# Patient Record
Sex: Male | Born: 2000 | Hispanic: No | Marital: Single
Health system: Southern US, Community
[De-identification: ages and names within clinical notes are randomized; demographics above are authoritative.]

## PROBLEM LIST (undated history)

## (undated) DIAGNOSIS — S6990XA Unspecified injury of unspecified wrist, hand and finger(s), initial encounter: Secondary | ICD-10-CM

## (undated) DIAGNOSIS — F909 Attention-deficit hyperactivity disorder, unspecified type: Secondary | ICD-10-CM

## (undated) DIAGNOSIS — R41840 Attention and concentration deficit: Secondary | ICD-10-CM

## (undated) DIAGNOSIS — F913 Oppositional defiant disorder: Secondary | ICD-10-CM

## (undated) HISTORY — PX: WISDOM TOOTH EXTRACTION: SHX21

---
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
## 2019-03-28 ENCOUNTER — Other Ambulatory Visit: Payer: Self-pay

## 2019-03-28 ENCOUNTER — Ambulatory Visit (INDEPENDENT_AMBULATORY_CARE_PROVIDER_SITE_OTHER): Payer: Medicaid Other

## 2019-03-28 ENCOUNTER — Encounter (HOSPITAL_COMMUNITY): Payer: Self-pay

## 2019-03-28 ENCOUNTER — Ambulatory Visit (HOSPITAL_COMMUNITY)
Admission: EM | Admit: 2019-03-28 | Discharge: 2019-03-28 | Disposition: A | Discharge status: Home / Self Care | Payer: Medicaid Other | Attending: Family Medicine | Admitting: Family Medicine

## 2019-03-28 DIAGNOSIS — S99921A Unspecified injury of right foot, initial encounter: Secondary | ICD-10-CM

## 2019-03-28 DIAGNOSIS — S92421A Displaced fracture of distal phalanx of right great toe, initial encounter for closed fracture: Secondary | ICD-10-CM

## 2019-03-28 DIAGNOSIS — W51XXXA Accidental striking against or bumped into by another person, initial encounter: Secondary | ICD-10-CM

## 2019-03-28 MED ORDER — IBUPROFEN 800 MG PO TABS: 800.0000 mg | ORAL_TABLET | Freq: Three times a day (TID) | ORAL | 0 refills | Status: AC
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
## 2019-03-28 NOTE — Discharge Instructions (Addendum)
Ice and elevate to reduce swelling May take ibuprofen 3 x a day with food Wear hard sole shoe/boot at all times when walking See orthopedic within the week.  Call them tomorrow to set up an appointment
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
## 2019-03-29 ENCOUNTER — Telehealth (HOSPITAL_COMMUNITY): Payer: Self-pay | Admitting: Emergency Medicine

## 2019-03-29 NOTE — Telephone Encounter (Signed)
Mother called asking for an ortho follow up.  The one provided at discharge is a hand specialist.    This nurse spoke to dr hagler.  Dr hagler suggested [facility] and/or [facility].    Gave mother both phone numbers and ended call
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
## 2021-04-18 ENCOUNTER — Other Ambulatory Visit: Payer: Self-pay

## 2021-04-18 ENCOUNTER — Emergency Department (HOSPITAL_BASED_OUTPATIENT_CLINIC_OR_DEPARTMENT_OTHER)
Admission: EM | Admit: 2021-04-18 | Discharge: 2021-04-18 | Disposition: A | Discharge status: Home / Self Care | Payer: Medicaid Other | Attending: Emergency Medicine | Admitting: Emergency Medicine

## 2021-04-18 ENCOUNTER — Encounter (HOSPITAL_BASED_OUTPATIENT_CLINIC_OR_DEPARTMENT_OTHER): Payer: Self-pay | Admitting: Emergency Medicine

## 2021-04-18 DIAGNOSIS — R21 Rash and other nonspecific skin eruption: Secondary | ICD-10-CM | POA: Diagnosis not present

## 2021-04-18 DIAGNOSIS — R112 Nausea with vomiting, unspecified: Secondary | ICD-10-CM | POA: Insufficient documentation

## 2021-04-18 DIAGNOSIS — Z7722 Contact with and (suspected) exposure to environmental tobacco smoke (acute) (chronic): Secondary | ICD-10-CM | POA: Insufficient documentation

## 2021-04-18 DIAGNOSIS — Y93B9 Activity, other involving muscle strengthening exercises: Secondary | ICD-10-CM | POA: Diagnosis not present

## 2021-04-18 DIAGNOSIS — X58XXXA Exposure to other specified factors, initial encounter: Secondary | ICD-10-CM | POA: Insufficient documentation

## 2021-04-18 DIAGNOSIS — L299 Pruritus, unspecified: Secondary | ICD-10-CM | POA: Diagnosis not present

## 2021-04-18 DIAGNOSIS — T782XXA Anaphylactic shock, unspecified, initial encounter: Secondary | ICD-10-CM | POA: Diagnosis not present

## 2021-04-18 DIAGNOSIS — Y92017 Garden or yard in single-family (private) house as the place of occurrence of the external cause: Secondary | ICD-10-CM | POA: Insufficient documentation

## 2021-04-18 MED ORDER — EPINEPHRINE 0.3 MG/0.3ML IJ SOAJ
0.3000 mg | Freq: Once | INTRAMUSCULAR | Status: AC
  Administered 2021-04-18: 0.3 mg via INTRAMUSCULAR
  Filled 2021-04-18: qty 0.3

## 2021-04-18 MED ORDER — SODIUM CHLORIDE 0.9 % IV BOLUS
1000.0000 mL | Freq: Once | INTRAVENOUS | Status: AC
  Administered 2021-04-18: 1000 mL via INTRAVENOUS

## 2021-04-18 MED ORDER — FAMOTIDINE IN NACL 20-0.9 MG/50ML-% IV SOLN
20.0000 mg | Freq: Once | INTRAVENOUS | Status: AC
  Administered 2021-04-18: 20 mg via INTRAVENOUS
  Filled 2021-04-18: qty 50

## 2021-04-18 MED ORDER — FAMOTIDINE 40 MG PO TABS: 40.0000 mg | ORAL_TABLET | Freq: Every day | ORAL | 0 refills | Status: AC

## 2021-04-18 MED ORDER — PREDNISONE 10 MG PO TABS: 20.0000 mg | ORAL_TABLET | Freq: Every day | ORAL | 0 refills | Status: AC

## 2021-04-18 MED ORDER — DIPHENHYDRAMINE HCL 25 MG PO TABS: 25.0000 mg | ORAL_TABLET | Freq: Four times a day (QID) | ORAL | 0 refills | Status: AC

## 2021-04-18 MED ORDER — METHYLPREDNISOLONE SODIUM SUCC 125 MG IJ SOLR
125.0000 mg | Freq: Once | INTRAMUSCULAR | Status: AC
  Administered 2021-04-18 (×2): 125 mg via INTRAVENOUS
  Filled 2021-04-18: qty 2

## 2021-04-18 MED ORDER — DIPHENHYDRAMINE HCL 50 MG/ML IJ SOLN
50.0000 mg | Freq: Once | INTRAMUSCULAR | Status: AC
  Administered 2021-04-18: 50 mg via INTRAVENOUS
  Filled 2021-04-18: qty 1

## 2021-04-18 NOTE — ED Provider Notes (Signed)
[facility] EMERGENCY DEPT Provider Note   CSN: [id number] Arrival date & time: 04/18/21  1805     History Chief Complaint  Patient presents with   Insect Bite    Paul Bell is a 20 y.o. male.  HPI     20yo male with history of ODD, ADHD, presents with concern for allergic reaction. Was working out in yard when he suddenly felt something on his foot.  He then developed nausea, vomiting, lightheadedness.  No dyspnea. Has rash all over and itching all over body. Occurred 1hr ago. No hx of prior anaphylaxis or allergy.  Not sure what he was stung by.   Past Medical History:  Diagnosis Date   ADHD (attention deficit hyperactivity disorder)    Finger injury 12/22/2014   bony mallet left long finger   ODD (oppositional defiant disorder)    Short attention span     There are no problems to display for this patient.   Past Surgical History:  Procedure Laterality Date   CLOSED REDUCTION FINGER WITH PERCUTANEOUS PINNING Left 01/07/2015   Procedure: CLOSED REDUCTION FINGER WITH PERCUTANEOUS PINNING LEFT LONG FINGER;  Surgeon: Betha Loa, MD;  Location: [facility];  Service: Orthopedics;  Laterality: Left;   INCISION AND DRAINAGE ABSCESS Left 02/21/2015   Procedure: INCISION AND DRAINAGE ABSCESS;  Surgeon: Betha Loa, MD;  Location: [facility];  Service: Orthopedics;  Laterality: Left;   WISDOM TOOTH EXTRACTION         Family History  Problem Relation Age of Onset   Asthma Sister    Diabetes Maternal Grandmother    Healthy Mother    Healthy Father     Social History   Tobacco Use   Smoking status: Passive Smoke Exposure - Never Smoker   Smokeless tobacco: Never   Tobacco comments:    mother smokes inside  Vaping Use   Vaping Use: Never used  Substance Use Topics   Alcohol use: No   Drug use: No    Home Medications Prior to Admission medications   Medication Sig Start Date End Date Taking? Authorizing Provider   diphenhydrAMINE (BENADRYL) 25 MG tablet Take 1 tablet (25 mg total) by mouth every 6 (six) hours for 3 days. 04/18/21 04/21/21 Yes Alvira Monday, MD  famotidine (PEPCID) 40 MG tablet Take 1 tablet (40 mg total) by mouth daily for 3 days. 04/18/21 04/21/21 Yes Alvira Monday, MD  ibuprofen (ADVIL) 800 MG tablet Take 1 tablet (800 mg total) by mouth 3 (three) times daily. 03/28/19  Yes Eustace Moore, MD  predniSONE (DELTASONE) 10 MG tablet Take 2 tablets (20 mg total) by mouth daily for 3 days. 04/18/21 04/21/21 Yes Alvira Monday, MD  amphetamine-dextroamphetamine (ADDERALL) 20 MG tablet Take 50 mg by mouth daily.    [provider]  CLONIDINE HCL PO Take by mouth 2 (two) times daily.     [provider]    Allergies    Patient has no known allergies.  Review of Systems   Review of Systems  Constitutional:  Negative for fever.  HENT:  Negative for drooling, sore throat and trouble swallowing.   Respiratory:  Negative for cough and shortness of breath.   Cardiovascular:  Negative for chest pain.  Gastrointestinal:  Positive for nausea and vomiting. Negative for abdominal pain.  Musculoskeletal:  Negative for back pain and neck stiffness.  Skin:  Positive for rash.  Neurological:  Positive for light-headedness. Negative for syncope.   Physical Exam Updated  Vital Signs BP (!) 126/59   Pulse 73   Temp 97.7 F (36.5 C)   Resp (!) 21   Ht 5\' 11"  (1.803 m)   Wt 68 kg   SpO2 99%   BMI 20.92 kg/m   Physical Exam Vitals and nursing note reviewed.  Constitutional:      General: He is not in acute distress.    Appearance: He is well-developed. He is not diaphoretic.  HENT:     Head: Normocephalic and atraumatic.  Eyes:     Conjunctiva/sclera: Conjunctivae normal.  Cardiovascular:     Rate and Rhythm: Normal rate and regular rhythm.     Heart sounds: Normal heart sounds. No murmur heard.   No friction rub. No gallop.  Pulmonary:     Effort: Pulmonary effort  is normal. No respiratory distress.     Breath sounds: Normal breath sounds. No wheezing or rales.  Abdominal:     General: There is no distension.     Palpations: Abdomen is soft.     Tenderness: There is no abdominal tenderness. There is no guarding.  Musculoskeletal:     Cervical back: Normal range of motion.  Skin:    General: Skin is warm and dry.     Comments: urticaria  Neurological:     Mental Status: He is alert and oriented to person, place, and time.    ED Results / Procedures / Treatments   Labs (all labs ordered are listed, but only abnormal results are displayed) Labs Reviewed - No data to display  EKG None  Radiology No results found.  Procedures Procedures   Medications Ordered in ED Medications  EPINEPHrine (EPI-PEN) injection 0.3 mg (0.3 mg Intramuscular Given 04/18/21 1834)  sodium chloride 0.9 % bolus 1,000 mL (0 mLs Intravenous Stopped 04/18/21 2008)  diphenhydrAMINE (BENADRYL) injection 50 mg (50 mg Intravenous Given 04/18/21 1836)  methylPREDNISolone sodium succinate (SOLU-MEDROL) 125 mg/2 mL injection 125 mg (125 mg Intravenous Given 04/18/21 1841)  famotidine (PEPCID) IVPB 20 mg premix (0 mg Intravenous Stopped 04/18/21 1934)  EPINEPHrine (EPI-PEN) injection 0.3 mg (0.3 mg Intramuscular Provided for home use 04/18/21 2326)    ED Course  I have reviewed the triage vital signs and the nursing notes.  Pertinent labs & imaging results that were available during my care of the patient were reviewed by me and considered in my medical decision making (see chart for details).    MDM Rules/Calculators/A&P                           20yo male with history of ODD, ADHD, presents with concern for allergic reaction to a bite/sting (unconfirmed insect.)  Suspect likely bee sting.  Symptoms of diffuse urticaria, nausea and vomiting consistent with anaphylaxis.  No sign of airway compromise.   Given epi, benadryl, famotidine, solumedrol with improvement of  symptoms and no rebound during observation in the ED. Given epi pen for home, steroids, recommend benadryl/famotidine. Patient discharged in stable condition with understanding of reasons to return.    Final Clinical Impression(s) / ED Diagnoses Final diagnoses:  Anaphylaxis, initial encounter    Rx / DC Orders ED Discharge Orders          Ordered    predniSONE (DELTASONE) 10 MG tablet  Daily        04/18/21 2259    famotidine (PEPCID) 40 MG tablet  Daily        04/18/21 2259  diphenhydrAMINE (BENADRYL) 25 MG tablet  Every 6 hours        04/18/21 2259             Alvira Monday, MD 04/19/21 [phone]

## 2021-04-18 NOTE — ED Notes (Signed)
Patient provided with PO Intake as requested.

## 2021-04-18 NOTE — ED Triage Notes (Signed)
Pt presents to ED BIB [facility]. Pt c/o insect bite to L ankle. Pt then began getting dizzy, n/v. EMS VSS.

## 2022-04-07 ENCOUNTER — Encounter (HOSPITAL_COMMUNITY): Payer: Self-pay | Admitting: *Deleted

## 2022-04-07 ENCOUNTER — Ambulatory Visit (HOSPITAL_COMMUNITY)
Admission: EM | Admit: 2022-04-07 | Discharge: 2022-04-07 | Disposition: A | Discharge status: Home / Self Care | Payer: Medicaid Other | Attending: Emergency Medicine | Admitting: Emergency Medicine

## 2022-04-07 ENCOUNTER — Other Ambulatory Visit: Payer: Self-pay

## 2022-04-07 DIAGNOSIS — R1011 Right upper quadrant pain: Secondary | ICD-10-CM

## 2022-04-07 MED ORDER — METHYLPREDNISOLONE SODIUM SUCC 125 MG IJ SOLR
INTRAMUSCULAR | Status: AC
  Filled 2022-04-07: qty 2

## 2022-04-07 MED ORDER — METHYLPREDNISOLONE SODIUM SUCC 125 MG IJ SOLR
60.0000 mg | Freq: Once | INTRAMUSCULAR | Status: AC
Start: 2022-04-07 — End: 2022-04-07
  Administered 2022-04-07: 60 mg via INTRAMUSCULAR

## 2022-04-07 MED ORDER — PREDNISONE 20 MG PO TABS: 40.0000 mg | ORAL_TABLET | Freq: Every day | ORAL | 0 refills | Status: DC

## 2022-04-07 MED ORDER — KETOROLAC TROMETHAMINE 30 MG/ML IJ SOLN: 30.0000 mg | Freq: Once | INTRAMUSCULAR | Status: DC

## 2022-04-07 MED ORDER — CYCLOBENZAPRINE HCL 10 MG PO TABS: 10.0000 mg | ORAL_TABLET | Freq: Every day | ORAL | 0 refills | Status: DC

## 2022-04-07 NOTE — ED Triage Notes (Signed)
Pt points to RUQ for pain site but verbalizes rib pain.

## 2022-04-07 NOTE — ED Provider Notes (Addendum)
[facility]    CSN: [id number] Arrival date & time: 04/07/22  1025      History   Chief Complaint Chief Complaint  Patient presents with   Rib Injury   Abdominal Pain    HPI Paul Bell is a 21 y.o. male.   Patient presents with right upper quadrant abdominal pain beginning 2-1/2 hours ago after he was kicked in the stomach by his friend while they were horse playing.  Pain is described as severe, rated a 10 out of 10.  Worsened by twisting turning and bending.  Patient endorses that he became lightheaded during the car ride to the emergency department causing him to lose consciousness for about 10 seconds before coming to .  Denies nausea, vomiting, urinary changes or symptoms, coughing, shortness of breath, chest pain or tightness.  Has not attempted treatment of symptoms.  Past Medical History:  Diagnosis Date   ADHD (attention deficit hyperactivity disorder)    Finger injury 12/22/2014   bony mallet left long finger   ODD (oppositional defiant disorder)    Short attention span     There are no problems to display for this patient.   Past Surgical History:  Procedure Laterality Date   CLOSED REDUCTION FINGER WITH PERCUTANEOUS PINNING Left 01/07/2015   Procedure: CLOSED REDUCTION FINGER WITH PERCUTANEOUS PINNING LEFT LONG FINGER;  Surgeon: Betha LoaKevin Kuzma, MD;  Location: [facility];  Service: Orthopedics;  Laterality: Left;   INCISION AND DRAINAGE ABSCESS Left 02/21/2015   Procedure: INCISION AND DRAINAGE ABSCESS;  Surgeon: Betha LoaKevin Kuzma, MD;  Location: [facility];  Service: Orthopedics;  Laterality: Left;   WISDOM TOOTH EXTRACTION         Home Medications    Prior to Admission medications   Medication Sig Start Date End Date Taking? Authorizing Provider  amphetamine-dextroamphetamine (ADDERALL) 20 MG tablet Take 50 mg by mouth daily.    [provider]  CLONIDINE HCL PO Take by mouth 2 (two) times daily.      [provider]  diphenhydrAMINE (BENADRYL) 25 MG tablet Take 1 tablet (25 mg total) by mouth every 6 (six) hours for 3 days. 04/18/21 04/21/21  Alvira MondaySchlossman, Erin, MD  famotidine (PEPCID) 40 MG tablet Take 1 tablet (40 mg total) by mouth daily for 3 days. 04/18/21 04/21/21  Alvira MondaySchlossman, Erin, MD  ibuprofen (ADVIL) 800 MG tablet Take 1 tablet (800 mg total) by mouth 3 (three) times daily. 03/28/19   Eustace MooreNelson, Yvonne Sue, MD    Family History Family History  Problem Relation Age of Onset   Asthma Sister    Diabetes Maternal Grandmother    Healthy Mother    Healthy Father     Social History Social History   Tobacco Use   Smoking status: Passive Smoke Exposure - Never Smoker   Smokeless tobacco: Never   Tobacco comments:    mother smokes inside  Vaping Use   Vaping Use: Never used  Substance Use Topics   Alcohol use: No   Drug use: No     Allergies   Patient has no known allergies.   Review of Systems Review of Systems  Constitutional: Negative.   Respiratory: Negative.    Cardiovascular: Negative.   Gastrointestinal:  Positive for abdominal pain. Negative for abdominal distention, anal bleeding, blood in stool, constipation, diarrhea, nausea, rectal pain and vomiting.  Skin: Negative.   Neurological: Negative.     Physical Exam Triage Vital Signs ED Triage Vitals  Enc Vitals Group  BP 04/07/22 1200 131/86     Pulse Rate 04/07/22 1200 (!) 54     Resp 04/07/22 1200 20     Temp 04/07/22 1200 98.1 F (36.7 C)     Temp src --      SpO2 04/07/22 1200 99 %     Weight --      Height --      Head Circumference --      Peak Flow --      Pain Score 04/07/22 1158 7     Pain Loc --      Pain Edu? --      Excl. in GC? --    No data found.  Updated Vital Signs BP 131/86   Pulse (!) 54   Temp 98.1 F (36.7 C)   Resp 20   SpO2 99%   Visual Acuity Right Eye Distance:   Left Eye Distance:   Bilateral Distance:    Right Eye Near:   Left Eye Near:     Bilateral Near:     Physical Exam Constitutional:      Appearance: Normal appearance. He is well-developed.  HENT:     Head: Normocephalic.  Eyes:     Extraocular Movements: Extraocular movements intact.  Pulmonary:     Effort: Pulmonary effort is normal.  Abdominal:     General: Abdomen is flat. Bowel sounds are normal.     Palpations: Abdomen is soft.     Tenderness: There is abdominal tenderness in the right upper quadrant.     Comments: No ecchymosis, hematoma, mass noted to the abdomen  Skin:    General: Skin is warm and dry.  Neurological:     General: No focal deficit present.     Mental Status: He is alert and oriented to person, place, and time.  Psychiatric:        Mood and Affect: Mood normal.        Behavior: Behavior normal.     UC Treatments / Results  Labs (all labs ordered are listed, but only abnormal results are displayed) Labs Reviewed - No data to display  EKG   Radiology No results found.  Procedures Procedures (including critical care time)  Medications Ordered in UC Medications - No data to display  Initial Impression / Assessment and Plan / UC Course  I have reviewed the triage vital signs and the nursing notes.  Pertinent labs & imaging results that were available during my care of the patient were reviewed by me and considered in my medical decision making (see chart for details).  Right upper quadrant abdominal pain  Vital signs are stable, EKG showing sinus bradycardia, while patient visibly uncomfortable he is in no signs of distress at this time, etiology is most likely muscular injury due to direct injury, low suspicion for organ involvement however patient is given strict precautions that if there are no signs of improvement in pain within the next 24 hours she begins to notice bruising or begins to have nausea or vomiting patient is to go to the nearest emergency department for further evaluation and imaging , given  methylprednisolone  injection given in office and prednisone 40 mg burst as well as Flexeril prescribed for outpatient management May use Tylenol in addition, recommended RICE, may follow-up with this urgent care as needed Final Clinical Impressions(s) / UC Diagnoses   Final diagnoses:  None   Discharge Instructions   None    ED Prescriptions   None  PDMP not reviewed this encounter.   Valinda Hoar, NP 04/07/22 1242    Valinda Hoar, NP 04/07/22 1243

## 2022-04-07 NOTE — Discharge Instructions (Addendum)
Today you are being treated for pain so that abdomen, as this injury occurred 2 hours ago we will help you manage the pain  If pain remains severe and you do not see any signs of improvement or you begin to have vomiting, bruising over the area or have a another episode of passing out you will need to go to the nearest emergency department for evaluation and imaging of your organs  You have been given an injection of steroids here in the office to help reduce any inflammation that occurs with muscle injury to help calm your pain  Starting tomorrow you will take prednisone every morning with food for 5 days to continue this process  You may use the muscle relaxer twice a day for additional comfort, be mindful this medication may make you drowsy, if this occurs you may use half a tablet or just use at bedtime  You may place ice over the affected area in 10 to 15-minute intervals  You may wear a compression binder for additional support and stability  You may place pillows behind back for additional support and comfort

## 2022-04-07 NOTE — ED Triage Notes (Signed)
Pt reports he was " Horse playing " with another family member about 4 hrs ago . Pt has developed Rt sided pain.

## 2022-09-09 ENCOUNTER — Encounter (HOSPITAL_COMMUNITY): Payer: Self-pay | Admitting: Emergency Medicine

## 2022-09-09 ENCOUNTER — Ambulatory Visit (HOSPITAL_COMMUNITY)
Admission: EM | Admit: 2022-09-09 | Discharge: 2022-09-09 | Disposition: A | Discharge status: Home / Self Care | Payer: Medicaid Other | Attending: Family Medicine | Admitting: Family Medicine

## 2022-09-09 DIAGNOSIS — S61412A Laceration without foreign body of left hand, initial encounter: Secondary | ICD-10-CM

## 2022-09-09 MED ORDER — ONDANSETRON 4 MG PO TBDP
ORAL_TABLET | ORAL | Status: AC
  Filled 2022-09-09: qty 1

## 2022-09-09 MED ORDER — LIDOCAINE HCL (PF) 1 % IJ SOLN
INTRAMUSCULAR | Status: AC
  Filled 2022-09-09: qty 30

## 2022-09-09 MED ORDER — ONDANSETRON 4 MG PO TBDP
4.0000 mg | ORAL_TABLET | Freq: Once | ORAL | Status: AC
Start: 2022-09-09 — End: 2022-09-09
  Administered 2022-09-09: 4 mg via ORAL

## 2022-09-09 NOTE — ED Triage Notes (Signed)
Pt reports a laceration in the palm of left hand. States injury occurred about 3 hours ago while trying to sharpen a knife. Unsure about last Tetanus.

## 2022-09-09 NOTE — Discharge Instructions (Signed)
If not allergic, you may use over the counter ibuprofen or acetaminophen as needed. ° °

## 2022-09-10 NOTE — ED Provider Notes (Signed)
  [facility]   [id number] 09/09/22 Arrival Time: 3888  ASSESSMENT & PLAN:  1. Laceration of left hand, foreign body presence unspecified, initial encounter    Meds ordered this encounter  Medications   ondansetron (ZOFRAN-ODT) disintegrating tablet 4 mg   Procedure: Laceration Repair Verbal consent obtained. Patient provided with risks and alternatives to the procedure. Wound copiously irrigated with NS then cleansed with betadine. Local anesthesia: Lidocaine 1% without epinephrine. Wound carefully explored. No foreign body, tendon injury, or nonviable tissue were noted. Using sterile technique, 4 interrupted 4-0 Prolene sutures were placed to reapproximate the wound. Minimal bleeding. Advised to look for and return for any signs of infection such as redness, swelling, discharge, or worsening pain. Return for suture removal in 7 days.  Complications: vagal reaction during procedure; quick recovery; sipping on fluids without difficulty.    Discharge Instructions      If not allergic, you may use over the counter ibuprofen or acetaminophen as needed.    Reviewed expectations re: course of current medical issues. Questions answered. Outlined signs and symptoms indicating need for more acute intervention. Patient verbalized understanding. After Visit Summary given.   SUBJECTIVE:  Paul Bell is a 21 y.o. male who presents with a laceration of his LEFT thumb; today; cut while sharpening knife.  Td UTD: yes; 2021   OBJECTIVE:  Vitals:   09/09/22 1921  BP: (!) 149/79  Pulse: 62  Resp: 18  Temp: 98.4 F (36.9 C)  TempSrc: Oral  SpO2: 98%    General appearance: alert; no distress Skin: linear laceration of LEFT thenar palmar hand; size: approx 1 cm; clean wound edges, no foreign bodies; without active bleeding Psychological: alert and cooperative; normal mood and affect  No Known Allergies  Past Medical History:  Diagnosis Date   ADHD (attention  deficit hyperactivity disorder)    Finger injury 12/22/2014   bony mallet left long finger   ODD (oppositional defiant disorder)    Short attention span    Social History   Socioeconomic History   Marital status: Single    Spouse name: Not on file   Number of children: Not on file   Years of education: Not on file   Highest education level: Not on file  Occupational History   Not on file  Tobacco Use   Smoking status: Passive Smoke Exposure - Never Smoker   Smokeless tobacco: Never   Tobacco comments:    mother smokes inside  Vaping Use   Vaping Use: Never used  Substance and Sexual Activity   Alcohol use: No   Drug use: No   Sexual activity: Never  Other Topics Concern   Not on file  Social History Narrative   Not on file   Social Determinants of Health   Financial Resource Strain: Not on file  Food Insecurity: Not on file  Transportation Needs: Not on file  Physical Activity: Not on file  Stress: Not on file  Social Connections: Not on file          Mardella Layman, MD 09/10/22 1353

## 2023-01-11 ENCOUNTER — Encounter (HOSPITAL_COMMUNITY): Payer: Self-pay

## 2023-01-11 ENCOUNTER — Ambulatory Visit (HOSPITAL_COMMUNITY)
Admission: EM | Admit: 2023-01-11 | Discharge: 2023-01-11 | Disposition: A | Discharge status: Home / Self Care | Payer: Self-pay | Attending: Nurse Practitioner | Admitting: Nurse Practitioner

## 2023-01-11 DIAGNOSIS — L02811 Cutaneous abscess of head [any part, except face]: Secondary | ICD-10-CM

## 2023-01-11 MED ORDER — DOXYCYCLINE HYCLATE 100 MG PO CAPS: 100.0000 mg | ORAL_CAPSULE | Freq: Two times a day (BID) | ORAL | 0 refills | Status: AC

## 2023-01-11 NOTE — ED Triage Notes (Signed)
Pt c/o a red tender raised area to rt upper cheek area x2 days. States it made it rt eye swell and giving him a headache. Took a goody powder with relief.

## 2023-01-11 NOTE — Discharge Instructions (Signed)
Please take the antibiotic doxycycline as prescribed to treat any infection that is in the hard area on your face.  This should also help with the headache.  You can also use warm compresses to help dry out infection.  Take Tylenol 500 to 1000 mg every 6 hours alternating with ibuprofen 800 mg every 8 hours as needed for pain.  Seek care if symptoms persist or worsen despite treatment.

## 2023-01-11 NOTE — ED Provider Notes (Signed)
[place]    CSN: [id number] Arrival date & time: 01/11/23  1154      History   Chief Complaint Chief Complaint  Patient presents with   Rash    HPI Paul Bell is a 22 y.o. male.   Patient presents today for swollen, painful area to the right side of his face for the past 2 days.  Reports it was more raised and red, however is gone away a little bit.  Reports area is still painful to touch and his right eye feels swollen.  He also has a headache.  No fever, chills,  nausea/vomiting.  Reports he took a Goody powder yesterday which helped with the pain.    Past Medical History:  Diagnosis Date   ADHD (attention deficit hyperactivity disorder)    Finger injury 12/22/2014   bony mallet left long finger   ODD (oppositional defiant disorder)    Short attention span     There are no problems to display for this patient.   Past Surgical History:  Procedure Laterality Date   CLOSED REDUCTION FINGER WITH PERCUTANEOUS PINNING Left 01/07/2015   Procedure: CLOSED REDUCTION FINGER WITH PERCUTANEOUS PINNING LEFT LONG FINGER;  Surgeon: Leanora Cover, MD;  Location: [facility];  Service: Orthopedics;  Laterality: Left;   INCISION AND DRAINAGE ABSCESS Left 02/21/2015   Procedure: INCISION AND DRAINAGE ABSCESS;  Surgeon: Leanora Cover, MD;  Location: [facility];  Service: Orthopedics;  Laterality: Left;   WISDOM TOOTH EXTRACTION         Home Medications    Prior to Admission medications   Medication Sig Start Date End Date Taking? Authorizing Provider  doxycycline (VIBRAMYCIN) 100 MG capsule Take 1 capsule (100 mg total) by mouth 2 (two) times daily for 7 days. 01/11/23 01/18/23 Yes Noemi Chapel A, NP  amphetamine-dextroamphetamine (ADDERALL) 20 MG tablet Take 50 mg by mouth daily. Patient not taking: Reported on 01/11/2023    [provider]  CLONIDINE HCL PO Take by mouth 2 (two) times daily.  Patient not taking: Reported  on 01/11/2023    [provider]  diphenhydrAMINE (BENADRYL) 25 MG tablet Take 1 tablet (25 mg total) by mouth every 6 (six) hours for 3 days. 04/18/21 04/21/21  Gareth Morgan, MD  famotidine (PEPCID) 40 MG tablet Take 1 tablet (40 mg total) by mouth daily for 3 days. 04/18/21 04/21/21  Gareth Morgan, MD  ibuprofen (ADVIL) 800 MG tablet Take 1 tablet (800 mg total) by mouth 3 (three) times daily. 03/28/19   Raylene Everts, MD    Family History Family History  Problem Relation Age of Onset   Asthma Sister    Diabetes Maternal Grandmother    Healthy Mother    Healthy Father     Social History Social History   Tobacco Use   Smoking status: Passive Smoke Exposure - Never Smoker   Smokeless tobacco: Never   Tobacco comments:    mother smokes inside  Vaping Use   Vaping Use: Never used  Substance Use Topics   Alcohol use: No   Drug use: No     Allergies   Patient has no known allergies.   Review of Systems Review of Systems Per HPI  Physical Exam Triage Vital Signs ED Triage Vitals [01/11/23 1316]  Enc Vitals Group     BP (!) 142/86     Pulse Rate (!) 59     Resp 18     Temp 97.8 F (36.6  C)     Temp Source Oral     SpO2 98 %     Weight      Height      Head Circumference      Peak Flow      Pain Score 5     Pain Loc      Pain Edu?      Excl. in [place]?    No data found.  Updated Vital Signs BP (!) 142/86 (BP Location: Left Arm)   Pulse (!) 59   Temp 97.8 F (36.6 C) (Oral)   Resp 18   SpO2 98%   Visual Acuity Right Eye Distance:   Left Eye Distance:   Bilateral Distance:    Right Eye Near:   Left Eye Near:    Bilateral Near:     Physical Exam Vitals and nursing note reviewed.  Constitutional:      General: He is not in acute distress.    Appearance: Normal appearance. He is not toxic-appearing.  HENT:     Head: Normocephalic and atraumatic.      Comments: Flesh colored, tender, indurated, mass approximately 1.5 x 1 cm and  circular consistent with abscess.  No active drainage, redness, warmth, or fluctuance.     Mouth/Throat:     Mouth: Mucous membranes are moist.     Pharynx: Oropharynx is clear. No oropharyngeal exudate or posterior oropharyngeal erythema.  Pulmonary:     Effort: Pulmonary effort is normal. No respiratory distress.  Skin:    General: Skin is warm and dry.     Capillary Refill: Capillary refill takes less than 2 seconds.     Coloration: Skin is not jaundiced or pale.     Findings: No erythema.  Neurological:     Mental Status: He is alert and oriented to person, place, and time.  Psychiatric:        Behavior: Behavior is cooperative.      UC Treatments / Results  Labs (all labs ordered are listed, but only abnormal results are displayed) Labs Reviewed - No data to display  EKG   Radiology No results found.  Procedures Procedures (including critical care time)  Medications Ordered in UC Medications - No data to display  Initial Impression / Assessment and Plan / UC Course  I have reviewed the triage vital signs and the nursing notes.  Pertinent labs & imaging results that were available during my care of the patient were reviewed by me and considered in my medical decision making (see chart for details).   Patient is well-appearing, normotensive, afebrile, not tachycardic, not tachypneic, oxygenating well on room air.    1. Abscess of head No indication for I&D today given no fluctuance Will treat with doxycycline twice daily for 7 days Supportive care discussed including warm compresses, Tylenol ibuprofen as needed for pain Seek care if symptoms persist or worsen despite treatment  The patient was given the opportunity to ask questions.  All questions answered to their satisfaction.  The patient is in agreement to this plan.    Final Clinical Impressions(s) / UC Diagnoses   Final diagnoses:  Abscess of head     Discharge Instructions      Please take the  antibiotic doxycycline as prescribed to treat any infection that is in the hard area on your face.  This should also help with the headache.  You can also use warm compresses to help dry out infection.  Take Tylenol 500 to 1000 mg  every 6 hours alternating with ibuprofen 800 mg every 8 hours as needed for pain.  Seek care if symptoms persist or worsen despite treatment.   ED Prescriptions     Medication Sig Dispense Auth. Provider   doxycycline (VIBRAMYCIN) 100 MG capsule Take 1 capsule (100 mg total) by mouth 2 (two) times daily for 7 days. 14 capsule Eulogio Bear, NP      PDMP not reviewed this encounter.   Eulogio Bear, NP 01/11/23 [phone]

## 2023-03-06 ENCOUNTER — Ambulatory Visit
Admission: EM | Admit: 2023-03-06 | Discharge: 2023-03-06 | Disposition: A | Discharge status: Home / Self Care | Payer: 59 | Attending: Nurse Practitioner | Admitting: Nurse Practitioner

## 2023-03-06 DIAGNOSIS — J069 Acute upper respiratory infection, unspecified: Secondary | ICD-10-CM | POA: Diagnosis present

## 2023-03-06 DIAGNOSIS — F909 Attention-deficit hyperactivity disorder, unspecified type: Secondary | ICD-10-CM | POA: Diagnosis not present

## 2023-03-06 DIAGNOSIS — Z7722 Contact with and (suspected) exposure to environmental tobacco smoke (acute) (chronic): Secondary | ICD-10-CM | POA: Diagnosis not present

## 2023-03-06 DIAGNOSIS — Z1152 Encounter for screening for COVID-19: Secondary | ICD-10-CM | POA: Diagnosis not present

## 2023-03-06 DIAGNOSIS — R051 Acute cough: Secondary | ICD-10-CM | POA: Diagnosis present

## 2023-03-06 NOTE — ED Provider Notes (Signed)
[facility]    CSN: [id number] Arrival date & time: 03/06/23  1534      History   Chief Complaint Chief Complaint  Patient presents with   Headache   Cough    HPI Paul Bell is a 22 y.o. male  presents for evaluation of URI symptoms for 2 days. Patient reports associated symptoms of cough, runny nose, and headache. Denies N/V/D, sore throat, fevers, ear pain, body aches, shortness of breath. Patient does not have a hx of asthma.  Does vape marijuana.  Reports family members have similar symptoms.  Pt has taken nothing OTC for symptoms. Pt has no other concerns at this time.    Headache Associated symptoms: cough   Cough Associated symptoms: headaches     Past Medical History:  Diagnosis Date   ADHD (attention deficit hyperactivity disorder)    Finger injury 12/22/2014   bony mallet left long finger   ODD (oppositional defiant disorder)    Short attention span     There are no problems to display for this patient.   Past Surgical History:  Procedure Laterality Date   CLOSED REDUCTION FINGER WITH PERCUTANEOUS PINNING Left 01/07/2015   Procedure: CLOSED REDUCTION FINGER WITH PERCUTANEOUS PINNING LEFT LONG FINGER;  Surgeon: Betha Loa, MD;  Location: [facility];  Service: Orthopedics;  Laterality: Left;   INCISION AND DRAINAGE ABSCESS Left 02/21/2015   Procedure: INCISION AND DRAINAGE ABSCESS;  Surgeon: Betha Loa, MD;  Location: [facility];  Service: Orthopedics;  Laterality: Left;   WISDOM TOOTH EXTRACTION         Home Medications    Prior to Admission medications   Medication Sig Start Date End Date Taking? Authorizing Provider  amphetamine-dextroamphetamine (ADDERALL) 20 MG tablet Take 50 mg by mouth daily. Patient not taking: Reported on 01/11/2023    [provider]  CLONIDINE HCL PO Take by mouth 2 (two) times daily.  Patient not taking: Reported on 01/11/2023    [provider]   diphenhydrAMINE (BENADRYL) 25 MG tablet Take 1 tablet (25 mg total) by mouth every 6 (six) hours for 3 days. 04/18/21 04/21/21  Alvira Monday, MD  famotidine (PEPCID) 40 MG tablet Take 1 tablet (40 mg total) by mouth daily for 3 days. 04/18/21 04/21/21  Alvira Monday, MD  ibuprofen (ADVIL) 800 MG tablet Take 1 tablet (800 mg total) by mouth 3 (three) times daily. 03/28/19   Eustace Moore, MD    Family History Family History  Problem Relation Age of Onset   Asthma Sister    Diabetes Maternal Grandmother    Healthy Mother    Healthy Father     Social History Social History   Tobacco Use   Smoking status: Passive Smoke Exposure - Never Smoker   Smokeless tobacco: Never   Tobacco comments:    mother smokes inside  Vaping Use   Vaping Use: Never used  Substance Use Topics   Alcohol use: No   Drug use: No     Allergies   Patient has no known allergies.   Review of Systems Review of Systems  Respiratory:  Positive for cough.   Neurological:  Positive for headaches.     Physical Exam Triage Vital Signs ED Triage Vitals  Enc Vitals Group     BP 03/06/23 1543 126/64     Pulse Rate 03/06/23 1542 88     Resp 03/06/23 1542 17     Temp 03/06/23 1542 98.8 F (37.1 C)  Temp Source 03/06/23 1542 Oral     SpO2 03/06/23 1542 96 %     Weight --      Height --      Head Circumference --      Peak Flow --      Pain Score 03/06/23 1541 8     Pain Loc --      Pain Edu? --      Excl. in [place]? --    No data found.  Updated Vital Signs BP 126/64   Pulse 88   Temp 98.8 F (37.1 C) (Oral)   Resp 17   SpO2 96%   Visual Acuity Right Eye Distance:   Left Eye Distance:   Bilateral Distance:    Right Eye Near:   Left Eye Near:    Bilateral Near:     Physical Exam Vitals and nursing note reviewed.  Constitutional:      General: He is not in acute distress.    Appearance: Normal appearance. He is not ill-appearing or toxic-appearing.     Comments: Patient  smells very strongly of marijuana  HENT:     Head: Normocephalic and atraumatic.     Right Ear: Tympanic membrane and ear canal normal.     Left Ear: Tympanic membrane and ear canal normal.     Nose: Rhinorrhea present. No congestion.     Mouth/Throat:     Mouth: Mucous membranes are moist.     Pharynx: No oropharyngeal exudate or posterior oropharyngeal erythema.  Eyes:     Pupils: Pupils are equal, round, and reactive to light.  Cardiovascular:     Rate and Rhythm: Normal rate and regular rhythm.     Heart sounds: Normal heart sounds.  Pulmonary:     Effort: Pulmonary effort is normal.     Breath sounds: Normal breath sounds.  Musculoskeletal:     Cervical back: Normal range of motion and neck supple.  Lymphadenopathy:     Cervical: No cervical adenopathy.  Skin:    General: Skin is warm and dry.  Neurological:     General: No focal deficit present.     Mental Status: He is alert and oriented to person, place, and time.  Psychiatric:        Mood and Affect: Mood normal.        Behavior: Behavior normal.      UC Treatments / Results  Labs (all labs ordered are listed, but only abnormal results are displayed) Labs Reviewed  SARS CORONAVIRUS 2 (TAT 6-24 HRS)    EKG   Radiology No results found.  Procedures Procedures (including critical care time)  Medications Ordered in UC Medications - No data to display  Initial Impression / Assessment and Plan / UC Course  I have reviewed the triage vital signs and the nursing notes.  Pertinent labs & imaging results that were available during my care of the patient were reviewed by me and considered in my medical decision making (see chart for details).     Reviewed exam and symptoms with patient.  No red flags. COVID PCR and will contact if positive Discussed viral illness and symptomatic treatment Rest and fluids Patient declined Rx cough medicine will use OTC medicines as needed PCP follow-up if symptoms do not  improve ER precautions reviewed and patient verbalized understanding Final Clinical Impressions(s) / UC Diagnoses   Final diagnoses:  Acute cough  Viral upper respiratory infection     Discharge Instructions  Your symptoms and exam are consistent for a viral illness. Please treat your symptoms with over the counter cough medication, tylenol or ibuprofen, humidifier, and rest. Viral illnesses can last 7-14 days. Please follow up with your PCP if your symptoms are not improving. Please go to the ER for any worsening symptoms. This includes but is not limited to fever you can not control with tylenol or ibuprofen, you are not able to stay hydrated, you have shortness of breath or chest pain.  Thank you for choosing [facility] for your healthcare needs. I hope you feel better soon!    ED Prescriptions   None    PDMP not reviewed this encounter.   Radford Pax, NP 03/06/23 [phone]

## 2023-03-06 NOTE — Discharge Instructions (Signed)
Your symptoms and exam are consistent for a viral illness. Please treat your symptoms with over the counter cough medication, tylenol or ibuprofen, humidifier, and rest. Viral illnesses can last 7-14 days. Please follow up with your PCP if your symptoms are not improving. Please go to the ER for any worsening symptoms. This includes but is not limited to fever you can not control with tylenol or ibuprofen, you are not able to stay hydrated, you have shortness of breath or chest pain.  Thank you for choosing [facility] for your healthcare needs. I hope you feel better soon!  

## 2023-03-06 NOTE — ED Triage Notes (Signed)
Pt presents with a headache and cough X 2 days.   Has not taken anything at home for relief.

## 2023-03-07 LAB — SARS CORONAVIRUS 2 (TAT 6-24 HRS): SARS Coronavirus 2: NEGATIVE

## 2023-06-19 ENCOUNTER — Ambulatory Visit
Admission: EM | Admit: 2023-06-19 | Discharge: 2023-06-19 | Disposition: A | Discharge status: Home / Self Care | Payer: 59 | Attending: Internal Medicine | Admitting: Internal Medicine

## 2023-06-19 DIAGNOSIS — S90512A Abrasion, left ankle, initial encounter: Secondary | ICD-10-CM | POA: Diagnosis not present

## 2023-06-19 MED ORDER — BACITRACIN ZINC 500 UNIT/GM EX OINT: 1.0000 | TOPICAL_OINTMENT | Freq: Two times a day (BID) | CUTANEOUS | 0 refills | Status: AC

## 2023-06-19 NOTE — ED Provider Notes (Signed)
[facility]  Note:  This document was prepared using Conservation officer, historic buildings and may include unintentional dictation errors.  MRN: [id number] DOB: 02/01/2001  Subjective:   Paul Bell is a 22 y.o. male presenting for 1 day history of acute onset left ankle pain, inflammation. Patient was trying to put a go cart away at work to get it out of the rain.  Unfortunately his foot slipped on the pedal and went past the break, scraping against the inside of the car.  Felt a hot sensation of the posterior left ankle but it is improved.  Still has some pain.  No current facility-administered medications for this encounter.  Current Outpatient Medications:    amphetamine-dextroamphetamine (ADDERALL) 20 MG tablet, Take 50 mg by mouth daily. (Patient not taking: Reported on 01/11/2023), Disp: , Rfl:    CLONIDINE HCL PO, Take by mouth 2 (two) times daily.  (Patient not taking: Reported on 01/11/2023), Disp: , Rfl:    diphenhydrAMINE (BENADRYL) 25 MG tablet, Take 1 tablet (25 mg total) by mouth every 6 (six) hours for 3 days., Disp: 12 tablet, Rfl: 0   famotidine (PEPCID) 40 MG tablet, Take 1 tablet (40 mg total) by mouth daily for 3 days., Disp: 3 tablet, Rfl: 0   ibuprofen (ADVIL) 800 MG tablet, Take 1 tablet (800 mg total) by mouth 3 (three) times daily., Disp: 21 tablet, Rfl: 0   No Known Allergies  Past Medical History:  Diagnosis Date   ADHD (attention deficit hyperactivity disorder)    Finger injury 12/22/2014   bony mallet left long finger   ODD (oppositional defiant disorder)    Short attention span      Past Surgical History:  Procedure Laterality Date   CLOSED REDUCTION FINGER WITH PERCUTANEOUS PINNING Left 01/07/2015   Procedure: CLOSED REDUCTION FINGER WITH PERCUTANEOUS PINNING LEFT LONG FINGER;  Surgeon: Betha Loa, MD;  Location: [facility];  Service: Orthopedics;  Laterality: Left;   INCISION AND DRAINAGE ABSCESS Left  02/21/2015   Procedure: INCISION AND DRAINAGE ABSCESS;  Surgeon: Betha Loa, MD;  Location: [facility];  Service: Orthopedics;  Laterality: Left;   WISDOM TOOTH EXTRACTION      Family History  Problem Relation Age of Onset   Asthma Sister    Diabetes Maternal Grandmother    Healthy Mother    Healthy Father     Social History   Tobacco Use   Smoking status: Passive Smoke Exposure - Never Smoker   Smokeless tobacco: Never   Tobacco comments:    mother smokes inside  Vaping Use   Vaping status: Every Day  Substance Use Topics   Alcohol use: Yes    Comment: occ   Drug use: Yes    Types: Marijuana    ROS   Objective:   Vitals: BP 112/73 (BP Location: Right Arm)   Pulse (!) 57   Temp 98.6 F (37 C) (Oral)   Resp 16   SpO2 98%   Physical Exam Constitutional:      General: He is not in acute distress.    Appearance: Normal appearance. He is well-developed and normal weight. He is not ill-appearing, toxic-appearing or diaphoretic.  HENT:     Head: Normocephalic and atraumatic.     Right Ear: External ear normal.     Left Ear: External ear normal.     Nose: Nose normal.     Mouth/Throat:     Pharynx: Oropharynx is clear.  Eyes:     General: No scleral icterus.       Right eye: No discharge.        Left eye: No discharge.     Extraocular Movements: Extraocular movements intact.  Cardiovascular:     Rate and Rhythm: Normal rate.  Pulmonary:     Effort: Pulmonary effort is normal.  Musculoskeletal:     Cervical back: Normal range of motion.     Left ankle: No swelling, deformity, ecchymosis or lacerations. Tenderness present. No lateral malleolus, medial malleolus, ATF ligament, AITF ligament, CF ligament, posterior TF ligament, base of 5th metatarsal or proximal fibula tenderness. Normal range of motion.     Left Achilles Tendon: Tenderness present. No defects. Thompson's test negative.       Legs:  Neurological:     Mental Status: He is alert  and oriented to person, place, and time.  Psychiatric:        Mood and Affect: Mood normal.        Behavior: Behavior normal.        Thought Content: Thought content normal.        Judgment: Judgment normal.    I applied a dressing using bacitracin, covered with nonadherent dressing and secured with Coban.  Assessment and Plan :   PDMP not reviewed this encounter.  1. Abrasion of left ankle without infection, initial encounter    Patient has a mild abrasion, wound care reviewed.  No signs of secondary cellulitis or infection.  Deferred imaging as I have no suspicion for fracture.  Counseled patient on potential for adverse effects with medications prescribed/recommended today, ER and return-to-clinic precautions discussed, patient verbalized understanding.    Wallis Bamberg, PA-C 06/19/23 1329

## 2023-06-19 NOTE — ED Triage Notes (Signed)
Pt injured left ankle on go cart pedal yesterday-no pain meds PTA-NAD-steady gait

## 2023-06-19 NOTE — Discharge Instructions (Addendum)
Change your dressing 2 times daily. Every time you change your dressing, clean the wound gently with warm water and Dial antibacterial soap. Pat the wound dry, let it breathe for roughly an hour before covering it back up. When you reapply a dressing, apply Bacitracin ointment to the wound, then cover with non-stick/non-adherent gauze.  After 4-5 days, the wound should scab over nicely and then you don't have to continue doing dressings.

## 2023-12-25 ENCOUNTER — Encounter (HOSPITAL_COMMUNITY): Payer: Self-pay | Admitting: Emergency Medicine

## 2023-12-25 ENCOUNTER — Emergency Department (HOSPITAL_COMMUNITY)
Admission: EM | Admit: 2023-12-25 | Discharge: 2023-12-25 | Disposition: A | Discharge status: Home / Self Care | Payer: 59 | Attending: Emergency Medicine | Admitting: Emergency Medicine

## 2023-12-25 ENCOUNTER — Emergency Department (HOSPITAL_COMMUNITY): Payer: 59

## 2023-12-25 ENCOUNTER — Other Ambulatory Visit: Payer: Self-pay

## 2023-12-25 DIAGNOSIS — Z79899 Other long term (current) drug therapy: Secondary | ICD-10-CM | POA: Diagnosis not present

## 2023-12-25 DIAGNOSIS — R197 Diarrhea, unspecified: Secondary | ICD-10-CM | POA: Diagnosis present

## 2023-12-25 DIAGNOSIS — K529 Noninfective gastroenteritis and colitis, unspecified: Secondary | ICD-10-CM | POA: Insufficient documentation

## 2023-12-25 LAB — RAPID URINE DRUG SCREEN, HOSP PERFORMED
Amphetamines: NOT DETECTED
Barbiturates: NOT DETECTED
Benzodiazepines: NOT DETECTED
Cocaine: NOT DETECTED
Opiates: NOT DETECTED
Tetrahydrocannabinol: POSITIVE — AB

## 2023-12-25 LAB — URINALYSIS, ROUTINE W REFLEX MICROSCOPIC
Bacteria, UA: NONE SEEN
Bilirubin Urine: NEGATIVE
Glucose, UA: NEGATIVE mg/dL
Ketones, ur: NEGATIVE mg/dL
Leukocytes,Ua: NEGATIVE
Nitrite: NEGATIVE
Protein, ur: NEGATIVE mg/dL
Specific Gravity, Urine: 1.02 (ref 1.005–1.030)
pH: 5 (ref 5.0–8.0)

## 2023-12-25 LAB — CBC
HCT: 49.6 % (ref 39.0–52.0)
Hemoglobin: 16.6 g/dL (ref 13.0–17.0)
MCH: 26 pg (ref 26.0–34.0)
MCHC: 33.5 g/dL (ref 30.0–36.0)
MCV: 77.7 fL — ABNORMAL LOW (ref 80.0–100.0)
Platelets: 325 10*3/uL (ref 150–400)
RBC: 6.38 MIL/uL — ABNORMAL HIGH (ref 4.22–5.81)
RDW: 13.1 % (ref 11.5–15.5)
WBC: 17.9 10*3/uL — ABNORMAL HIGH (ref 4.0–10.5)
nRBC: 0 % (ref 0.0–0.2)

## 2023-12-25 LAB — COMPREHENSIVE METABOLIC PANEL
ALT: 14 U/L (ref 0–44)
AST: 20 U/L (ref 15–41)
Albumin: 4.8 g/dL (ref 3.5–5.0)
Alkaline Phosphatase: 63 U/L (ref 38–126)
Anion gap: 11 (ref 5–15)
BUN: 11 mg/dL (ref 6–20)
CO2: 20 mmol/L — ABNORMAL LOW (ref 22–32)
Calcium: 9.7 mg/dL (ref 8.9–10.3)
Chloride: 107 mmol/L (ref 98–111)
Creatinine, Ser: 0.96 mg/dL (ref 0.61–1.24)
GFR, Estimated: >60 mL/min (ref >60)
Glucose, Bld: 96 mg/dL (ref 70–99)
Potassium: 3.9 mmol/L (ref 3.5–5.1)
Sodium: 138 mmol/L (ref 135–145)
Total Bilirubin: 1.2 mg/dL (ref 0.0–1.2)
Total Protein: 8.4 g/dL — ABNORMAL HIGH (ref 6.5–8.1)

## 2023-12-25 LAB — RESP PANEL BY RT-PCR (RSV, FLU A&B, COVID)  RVPGX2
Influenza A by PCR: NEGATIVE
Influenza B by PCR: NEGATIVE
Resp Syncytial Virus by PCR: NEGATIVE
SARS Coronavirus 2 by RT PCR: NEGATIVE

## 2023-12-25 LAB — ETHANOL: Alcohol, Ethyl (B): <10 mg/dL (ref <10)

## 2023-12-25 LAB — LIPASE, BLOOD: Lipase: 26 U/L (ref 11–51)

## 2023-12-25 MED ORDER — METOCLOPRAMIDE HCL 5 MG/ML IJ SOLN
10.0000 mg | Freq: Once | INTRAMUSCULAR | Status: AC
  Administered 2023-12-25: 10 mg via INTRAVENOUS
  Filled 2023-12-25: qty 2

## 2023-12-25 MED ORDER — ONDANSETRON 4 MG PO TBDP
4.0000 mg | ORAL_TABLET | Freq: Three times a day (TID) | ORAL | 0 refills | Status: AC | PRN
Start: 2023-12-25 — End: ?

## 2023-12-25 MED ORDER — IOHEXOL 350 MG/ML SOLN
75.0000 mL | Freq: Once | INTRAVENOUS | Status: AC | PRN
  Administered 2023-12-25: 75 mL via INTRAVENOUS

## 2023-12-25 MED ORDER — LACTATED RINGERS IV BOLUS
1000.0000 mL | Freq: Once | INTRAVENOUS | Status: AC
Start: 2023-12-25 — End: 2023-12-25
  Administered 2023-12-25: 1000 mL via INTRAVENOUS

## 2023-12-25 MED ORDER — ONDANSETRON HCL 4 MG/2ML IJ SOLN
4.0000 mg | Freq: Once | INTRAMUSCULAR | Status: AC
Start: 2023-12-25 — End: 2023-12-25
  Administered 2023-12-25: 4 mg via INTRAVENOUS
  Filled 2023-12-25: qty 2

## 2023-12-25 MED ORDER — FENTANYL CITRATE PF 50 MCG/ML IJ SOSY
12.5000 ug | PREFILLED_SYRINGE | Freq: Once | INTRAMUSCULAR | Status: DC
  Filled 2023-12-25: qty 1

## 2023-12-25 NOTE — ED Provider Notes (Signed)
 [place] EMERGENCY DEPARTMENT AT [facility] Provider Note   CSN: [id number] Arrival date & time: 12/25/23  4401     History Chief Complaint  Patient presents with   Emesis    Paul Bell is a 23 y.o. male.  Patient without significant medical history presents the emergency department concerns of vomiting.  States that he woke up this morning and has had multiple results of nausea, vomiting.  States that he vomited about 40-50 times since 4 AM.  Endorses that he had some diarrhea yesterday but was feeling generally okay.  No recent fever, urinary symptoms, abdominal pain with exception of some cramping when feels the urge to vomit, and no chest pain or shortness of breath.  No history of similar symptoms.  Denies any recent alcohol use, substance use, or exposure to any sick individuals.  No prior history of diabetes.   Emesis      Home Medications Prior to Admission medications   Medication Sig Start Date End Date Taking? Authorizing Provider  ondansetron (ZOFRAN-ODT) 4 MG disintegrating tablet Take 1 tablet (4 mg total) by mouth every 8 (eight) hours as needed. 12/25/23  Yes Smitty Knudsen, PA-C  amphetamine-dextroamphetamine (ADDERALL) 20 MG tablet Take 50 mg by mouth daily. Patient not taking: Reported on 01/11/2023    [provider]  bacitracin ointment Apply 1 Application topically 2 (two) times daily. 06/19/23   Wallis Bamberg, PA-C  CLONIDINE HCL PO Take by mouth 2 (two) times daily.  Patient not taking: Reported on 01/11/2023    [provider]  diphenhydrAMINE (BENADRYL) 25 MG tablet Take 1 tablet (25 mg total) by mouth every 6 (six) hours for 3 days. 04/18/21 04/21/21  Alvira Monday, MD  famotidine (PEPCID) 40 MG tablet Take 1 tablet (40 mg total) by mouth daily for 3 days. 04/18/21 04/21/21  Alvira Monday, MD  ibuprofen (ADVIL) 800 MG tablet Take 1 tablet (800 mg total) by mouth 3 (three) times daily. 03/28/19   Eustace Moore, MD       Allergies    Patient has no known allergies.    Review of Systems   Review of Systems  Gastrointestinal:  Positive for nausea and vomiting.  All other systems reviewed and are negative.   Physical Exam Updated Vital Signs BP 126/75   Pulse 61   Temp 97.7 F (36.5 C)   Resp 20   Ht 5\' 11"  (1.803 m)   Wt 68 kg   SpO2 100%   BMI 20.91 kg/m  Physical Exam Vitals and nursing note reviewed.  Constitutional:      General: He is not in acute distress.    Appearance: He is well-developed.  HENT:     Head: Normocephalic and atraumatic.  Eyes:     Conjunctiva/sclera: Conjunctivae normal.  Cardiovascular:     Rate and Rhythm: Normal rate and regular rhythm.     Heart sounds: No murmur heard. Pulmonary:     Effort: Pulmonary effort is normal. No respiratory distress.     Breath sounds: Normal breath sounds.  Abdominal:     Palpations: Abdomen is soft.     Tenderness: There is no abdominal tenderness.  Musculoskeletal:        General: No swelling.     Cervical back: Neck supple.  Skin:    General: Skin is warm and dry.     Capillary Refill: Capillary refill takes less than 2 seconds.  Neurological:     Mental Status: He is  alert.  Psychiatric:        Mood and Affect: Mood normal.     ED Results / Procedures / Treatments   Labs (all labs ordered are listed, but only abnormal results are displayed) Labs Reviewed  COMPREHENSIVE METABOLIC PANEL - Abnormal; Notable for the following components:      Result Value   CO2 20 (*)    Total Protein 8.4 (*)    All other components within normal limits  CBC - Abnormal; Notable for the following components:   WBC 17.9 (*)    RBC 6.38 (*)    MCV 77.7 (*)    All other components within normal limits  URINALYSIS, ROUTINE W REFLEX MICROSCOPIC - Abnormal; Notable for the following components:   Hgb urine dipstick SMALL (*)    All other components within normal limits  RAPID URINE DRUG SCREEN, HOSP PERFORMED - Abnormal; Notable  for the following components:   Tetrahydrocannabinol POSITIVE (*)    All other components within normal limits  RESP PANEL BY RT-PCR (RSV, FLU A&B, COVID)  RVPGX2  LIPASE, BLOOD  ETHANOL  CBG MONITORING, ED    EKG EKG Interpretation Date/Time:  Saturday December 25 2023 10:40:42 EST Ventricular Rate:  56 PR Interval:  140 QRS Duration:  122 QT Interval:  429 QTC Calculation: 414 R Axis:   96  Text Interpretation: Sinus rhythm Nonspecific intraventricular conduction delay ST elev, probable normal early repol pattern when compared to prior, t wave inversion oin lead 3 NO STEMI Confirmed by Theda Belfast ([id number]) on 12/25/2023 11:38:45 AM  Radiology CT ABDOMEN PELVIS W CONTRAST Result Date: 12/25/2023 CLINICAL DATA:  Abdominal pain, acute, nonlocalized EXAM: CT ABDOMEN AND PELVIS WITH CONTRAST TECHNIQUE: Multidetector CT imaging of the abdomen and pelvis was performed using the standard protocol following bolus administration of intravenous contrast. RADIATION DOSE REDUCTION: This exam was performed according to the departmental dose-optimization program which includes automated exposure control, adjustment of the mA and/or kV according to patient size and/or use of iterative reconstruction technique. CONTRAST:  75mL OMNIPAQUE IOHEXOL 350 MG/ML SOLN COMPARISON:  None Available. FINDINGS: Lower chest: No acute abnormality. Hepatobiliary: No focal liver abnormality. No gallstones, gallbladder wall thickening, or pericholecystic fluid. No biliary dilatation. Pancreas: No focal lesion. Normal pancreatic contour. No surrounding inflammatory changes. No main pancreatic ductal dilatation. Spleen: Normal in size without focal abnormality. Adrenals/Urinary Tract: A 1.1 cm left adrenal gland nodule with a density of 37 Hounsfield units no right adrenal gland nodule. Bilateral kidneys enhance symmetrically. Fluid density lesions likely represent simple renal cysts. Simple renal cysts, in the absence of  clinically indicated signs/symptoms, require no independent follow-up. No hydronephrosis. No hydroureter. The urinary bladder is unremarkable. Stomach/Bowel: Stomach is within normal limits. No evidence of bowel wall thickening or dilatation. Fluid throughout the lumen of the large bowel. Appendix appears normal. Vascular/Lymphatic: No abdominal aorta or iliac aneurysm. No abdominal, pelvic, or inguinal lymphadenopathy. Reproductive: Prostate is unremarkable. Other: No intraperitoneal free fluid. No intraperitoneal free gas. No organized fluid collection. Musculoskeletal: No abdominal wall hernia or abnormality. No suspicious lytic or blastic osseous lesions. No acute displaced fracture. IMPRESSION: 1. Diarrheal state. 2. A 1.1 cm left adrenal gland nodule likely represents an adrenal adenoma. Recommend follow-up CT or MRI adrenal gland protocol in 1 year to evaluate for stability and further evaluate lesion. Electronically Signed   By: Tish Frederickson M.D.   On: 12/25/2023 12:36    Procedures Procedures    Medications Ordered in ED Medications  fentaNYL (SUBLIMAZE)  injection 12.5 mcg (0 mcg Intravenous Hold 12/25/23 1047)  lactated ringers bolus 1,000 mL (0 mLs Intravenous Stopped 12/25/23 1209)  ondansetron (ZOFRAN) injection 4 mg (4 mg Intravenous Given 12/25/23 1046)  metoCLOPramide (REGLAN) injection 10 mg (10 mg Intravenous Given 12/25/23 1141)  iohexol (OMNIPAQUE) 350 MG/ML injection 75 mL (75 mLs Intravenous Contrast Given 12/25/23 1209)    ED Course/ Medical Decision Making/ A&P                                 Medical Decision Making Amount and/or Complexity of Data Reviewed Labs: ordered. Radiology: ordered.  Risk Prescription drug management.   This patient presents to the ED for concern of nausea and vomiting.  Differential diagnosis includes DKA, gastroenteritis, food poisoning, norovirus   Lab Tests:  I Ordered, and personally interpreted labs.  The pertinent results  include: CBC with leukocytosis at 17.9, right count up to 6.38 possibly getting hemoconcentration, CMP unremarkable, respiratory panel negative for COVID-19, influenza and RSV, UA with small hemoglobin seen but no obvious signs of infection, UDS positive for cannabis, ethanol less than 10.   Imaging Studies ordered:  I ordered imaging studies including CT abdomen pelvis I independently visualized and interpreted imaging which showed Diarrheal state. 2. A 1.1 cm left adrenal gland nodule likely represents an adrenal adenoma. Recommend follow-up CT or MRI adrenal gland protocol in 1 year to evaluate for stability and further evaluate lesion. I agree with the radiologist interpretation   Medicines ordered and prescription drug management:  I ordered medication including LR, Zofran, fentanyl for dehydration, nausea, pain Reevaluation of the patient after these medicines showed that the patient improved I have reviewed the patients home medicines and have made adjustments as needed   Problem List / ED Course:  Patient without significant medical history presents the emergency department concerns of vomiting.  States that he woke up this morning and has had multiple episodes of nausea and vomiting.  Described a nurse about 40-50 episodes of vomiting since 4 AM.  Denies any recent alcohol or substance use.  No exposure to any individuals with similar symptoms.  Denies any recent fever chills or bodyaches.  No chest pain shortness of breath. On exam, there are hyperactive bowel sounds but there is no significant abdominal tenderness.  Lungs and heart are clear to auscultation with no murmurs, rubs, or gallops.  No wheezing rales or rhonchi.  At this time, will need to obtain labs and EKG for assessment of hydration status.  Concern for severe dehydration if patient truly has had 40-50 episodes of vomiting.  Unclear etiology at this time. Patient's workup is thankfully reassuring.  Leukocytosis at 17.9  possibly reactive in nature.  CMP does not reveal significant dehydration.  UDS is positive for cannabis so concern for possible hyperemesis syndrome. CT and pelvis is also unremarkable.  Patient does report improvement with the Zofran and Reglan.  No active episodes of vomiting in the emergency department. Discussed findings with family and encourage management of symptoms at home with antiemetic medications and gentle rehydration.  Return precautions discussed such as worsening abdominal pain, fevers, intractable vomiting. Patient has stabilized and without any focal abnormal findings, I feel the patient stable for outpatient follow-up and discharged home.   Social Determinants of Health:  Cannabis use  Final Clinical Impression(s) / ED Diagnoses Final diagnoses:  Gastroenteritis    Rx / DC Orders ED Discharge Orders  Ordered    ondansetron (ZOFRAN-ODT) 4 MG disintegrating tablet  Every 8 hours PRN        12/25/23 1311              Salomon Mast 12/25/23 1609    Tegeler, Canary Brim, MD 12/25/23 [phone]

## 2023-12-25 NOTE — ED Triage Notes (Addendum)
 Pt presents from home for N/V x1 day. Started as epigastric pain that progressed to emesis, states "I've vomited 40 or 50 times since 4am."  Endorses diarrhea since yesterday Denies fever, urinary sx, abd pain while in triage  In triage, NAD, ambulatory, A&Ox4

## 2023-12-25 NOTE — Discharge Instructions (Addendum)
 You were seen in the emergency department today for concerns of vomiting abdominal pain.  Your labs were thankfully reassuring with exception of some elevation in your white blood count.  This may have been due to the excessive vomiting you are experiencing.  You seem to have good response with the Zofran sinusitis medication to your pharmacy.  Your CT scan was thankfully negative for any acute findings.  You should continue to manage symptoms with aggressive hydration to reduce the risk of heart return to the ER for dehydration.  For any concerns of new or worsening symptoms, return the emergency department.  Otherwise, please follow-up with your primary care provider.
